# Patient Record
Sex: Male | Born: 1950 | Race: White | Hispanic: No | Marital: Married | Smoking: Never smoker
Health system: Southern US, Community
[De-identification: ages and names within clinical notes are randomized; demographics above are authoritative.]

## PROBLEM LIST (undated history)

## (undated) DIAGNOSIS — I10 Essential (primary) hypertension: Secondary | ICD-10-CM

---

## 2005-02-19 ENCOUNTER — Ambulatory Visit: Payer: Self-pay | Admitting: Internal Medicine

## 2005-03-14 ENCOUNTER — Encounter: Payer: Self-pay | Admitting: Internal Medicine

## 2008-10-01 ENCOUNTER — Ambulatory Visit: Payer: Self-pay | Admitting: Gastroenterology

## 2018-09-20 ENCOUNTER — Emergency Department
Admission: EM | Admit: 2018-09-20 | Discharge: 2018-09-20 | Disposition: A | Discharge status: Home / Self Care | Payer: Medicare HMO | Attending: Emergency Medicine | Admitting: Emergency Medicine

## 2018-09-20 ENCOUNTER — Other Ambulatory Visit: Payer: Self-pay

## 2018-09-20 ENCOUNTER — Emergency Department: Payer: Medicare HMO

## 2018-09-20 ENCOUNTER — Encounter: Payer: Self-pay | Admitting: Emergency Medicine

## 2018-09-20 DIAGNOSIS — W19XXXA Unspecified fall, initial encounter: Secondary | ICD-10-CM

## 2018-09-20 DIAGNOSIS — W010XXA Fall on same level from slipping, tripping and stumbling without subsequent striking against object, initial encounter: Secondary | ICD-10-CM | POA: Insufficient documentation

## 2018-09-20 DIAGNOSIS — M79651 Pain in right thigh: Secondary | ICD-10-CM | POA: Insufficient documentation

## 2018-09-20 DIAGNOSIS — I10 Essential (primary) hypertension: Secondary | ICD-10-CM | POA: Diagnosis not present

## 2018-09-20 HISTORY — DX: Essential (primary) hypertension: I10

## 2018-09-20 MED ORDER — ONDANSETRON HCL 4 MG PO TABS: 4.0000 mg | ORAL_TABLET | Freq: Three times a day (TID) | ORAL | 1 refills | Status: AC | PRN

## 2018-09-20 MED ORDER — ONDANSETRON 4 MG PO TBDP
4.0000 mg | ORAL_TABLET | Freq: Once | ORAL | Status: AC
  Administered 2018-09-20: 4 mg via ORAL
  Filled 2018-09-20: qty 1

## 2018-09-20 MED ORDER — TRAMADOL HCL 50 MG PO TABS
50.0000 mg | ORAL_TABLET | Freq: Once | ORAL | Status: AC
  Administered 2018-09-20: 22:00:00 50 mg via ORAL
  Filled 2018-09-20: qty 1

## 2018-09-20 MED ORDER — TRAMADOL HCL 50 MG PO TABS: 50.0000 mg | ORAL_TABLET | Freq: Four times a day (QID) | ORAL | 0 refills | Status: AC | PRN

## 2018-09-20 NOTE — ED Triage Notes (Signed)
Pt to ED via POV c/o fall. Pt states that he slipped carrying something up the step and landed on his right side. Pt is c/o pain in the right thigh area. Pt is in NAD at this time.

## 2018-09-20 NOTE — ED Provider Notes (Signed)
[facility] Emergency Department Provider Note  ____________________________________________  Time seen: Approximately 8:32 PM  I have reviewed the triage vital signs and the nursing notes.   HISTORY  Chief Complaint Fall and Leg Pain    HPI Larry Bates is a 68 y.o. male presents to the emergency department with right distal lateral thigh pain since patient fell earlier in the evening.  Patient slipped carrying an object.  He denies hitting his head or his neck.  He denies headache.  No numbness or tingling in the upper or lower extremities.  Patient reports that he has no pain when sitting and 8 out of 10 pain when attempting to bear weight.  He denies pain along the groin or along the low back.  He denies current use of blood thinners.  No other alleviating measures of been attempted.   Past Medical History:  Diagnosis Date  . Hypertension     There are no active problems to display for this patient.   History reviewed. No pertinent surgical history.  Prior to Admission medications   Not on File    Allergies Brassica oleracea italica  No family history on file.  Social History Social History   Tobacco Use  . Smoking status: Never Smoker  . Smokeless tobacco: Never Used  Substance Use Topics  . Alcohol use: Yes  . Drug use: Not Currently     Review of Systems  Constitutional: No fever/chills Eyes: No visual changes. No discharge ENT: No upper respiratory complaints. Cardiovascular: no chest pain. Respiratory: no cough. No SOB. Gastrointestinal: No abdominal pain.  No nausea, no vomiting.  No diarrhea.  No constipation. Genitourinary: Negative for dysuria. No hematuria Musculoskeletal: Patient has right thigh pain.  Skin: Negative for rash, abrasions, lacerations, ecchymosis. Neurological: Negative for headaches, focal weakness or numbness.   ____________________________________________   PHYSICAL EXAM:  VITAL SIGNS: ED  Triage Vitals  Enc Vitals Group     BP 09/20/18 1743 131/67     Pulse Rate 09/20/18 1743 74     Resp 09/20/18 1743 18     Temp 09/20/18 1743 98.1 F (36.7 C)     Temp Source 09/20/18 1743 Oral     SpO2 09/20/18 1743 96 %     Weight 09/20/18 1744 185 lb (83.9 kg)     Height 09/20/18 1744  (1.727 m)     Head Circumference --      Peak Flow --      Pain Score 09/20/18 1746 2     Pain Loc --      Pain Edu? --      Excl. in GC? --      Constitutional: Alert and oriented. Well appearing and in no acute distress. Eyes: Conjunctivae are normal. PERRL. EOMI. Head: Atraumatic. ENT:      Ears: TMs are pearly.       Nose: No congestion/rhinnorhea.      Mouth/Throat: Mucous membranes are moist.  Neck: No stridor. Full range of motion. No midline c spine tenderness.   Cardiovascular: Normal rate, regular rhythm. Normal S1 and S2.  Good peripheral circulation. Respiratory: Normal respiratory effort without tachypnea or retractions. Lungs CTAB. Good air entry to the bases with no decreased or absent breath sounds. Gastrointestinal: Bowel sounds 4 quadrants. Soft and nontender to palpation. No guarding or rigidity. No palpable masses. No distention. No CVA tenderness. Musculoskeletal: No groin pain with internal and external rotation of the right hip.  Patient is able to  perform a positive straight leg raise test, right.  Patient has difficulty ambulating and gestures to right lateral thigh when walking.  He performs full range of motion at the right knee without deficits noted with provocative testing.  Palpable dorsalis pedis pulse, right. Neurologic:  Normal speech and language. No gross focal neurologic deficits are appreciated.  Skin:  Skin is warm, dry and intact. No rash noted. Psychiatric: Mood and affect are normal. Speech and behavior are normal. Patient exhibits appropriate insight and judgement.   ____________________________________________   LABS (all labs ordered are  listed, but only abnormal results are displayed)  Labs Reviewed - No data to display ____________________________________________  EKG   ____________________________________________  RADIOLOGY I personally viewed and evaluated these images as part of my medical decision making, as well as reviewing the written report by the radiologist  Dg Femur, Min 2 Views Right  Result Date: 09/20/2018 CLINICAL DATA:  68 year old male status post fall down steps with pain. EXAM: RIGHT FEMUR 2 VIEWS COMPARISON:  None. FINDINGS: Calcified right femoral artery atherosclerosis. Right femoral head normally located. Intact proximal right femur. Visible pelvis appears intact. Right SI joint appears normal. Intact right femoral shaft. Normal alignment at the right knee. No knee joint effusion identified. No acute osseous abnormality identified. IMPRESSION: 1. No acute fracture or dislocation identified about the right femur. 2. Calcified femoral artery atherosclerosis. Electronically Signed   By: Odessa Fleming M.D.   On: 09/20/2018 18:52    ____________________________________________    PROCEDURES  Procedure(s) performed:    Procedures    Medications - No data to display   ____________________________________________   INITIAL IMPRESSION / ASSESSMENT AND PLAN / ED COURSE  Pertinent labs & imaging results that were available during my care of the patient were reviewed by me and considered in my medical decision making (see chart for details).  Review of the [place] CSRS was performed in accordance of the [facility] prior to dispensing any controlled drugs.      Assessment and Plan:  Fall  68 year old male presents to the emergency department with right lateral thigh pain that occurred after a fall in his home earlier in the evening.  On physical exam, patient had difficulty with ambulation and reported pain along the right lateral thigh he walks with a limp.  He had no groin pain with internal and external  rotation of the right hip and was able to demonstrate full range of motion at the right knee.  Had no paraspinal muscle tenderness along the lumbar spine.  Differential diagnosis included hip fracture, thigh hematoma and leg contusion.  X-ray examination of the right hip revealed no bony abnormality.  Ultrasound of right thigh revealed no evidence of hematoma formation.  Patient was given tramadol in the emergency department and Zofran.  He was discharged with tramadol and Zofran and advised to follow-up with orthopedics.  He voiced understanding.  He was given strict return precautions to return to the emergency department with new or worsening symptoms.  All patient questions were answered.   ____________________________________________  FINAL CLINICAL IMPRESSION(S) / ED DIAGNOSES  Final diagnoses:  Fall      NEW MEDICATIONS STARTED DURING THIS VISIT:  ED Discharge Orders    None          This chart was dictated using voice recognition software/Dragon. Despite best efforts to proofread, errors can occur which can change the meaning. Any change was purely unintentional.    Orvil Feil, PA-C 09/20/18 2212  Emily FilbertWilliams, Jonathan E, MD 09/20/18 2240

## 2018-09-20 NOTE — ED Notes (Signed)
Pt tripped today and has knee/leg pain (rt)

## 2020-02-15 IMAGING — US RIGHT LOWER EXTREMITY SOFT TISSUE ULTRASOUND LIMITED
1 series · 8 of 8 positions shown · non-contrast
Comparison: None.

CLINICAL DATA: Recent fall with possible thigh hematoma

EXAM:
ULTRASOUND RIGHT LOWER EXTREMITY LIMITED
TECHNIQUE: Ultrasound examination of the lower extremity soft tissues was
performed in the area of clinical concern.

[Series 1: right lower extremity soft tissue ultrasound limit · 8 of 8 slices shown]
[im 1/8]
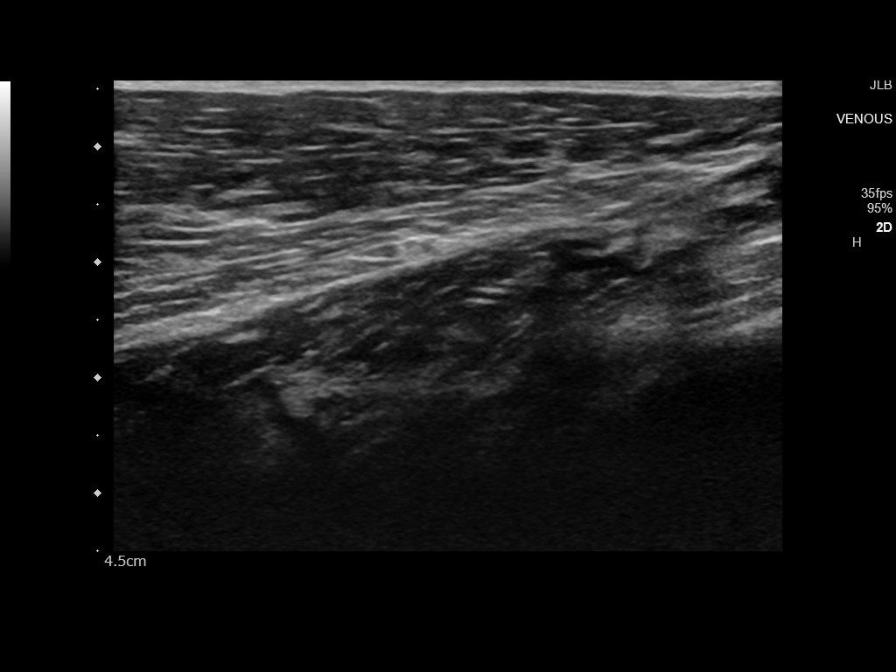
[im 2/8]
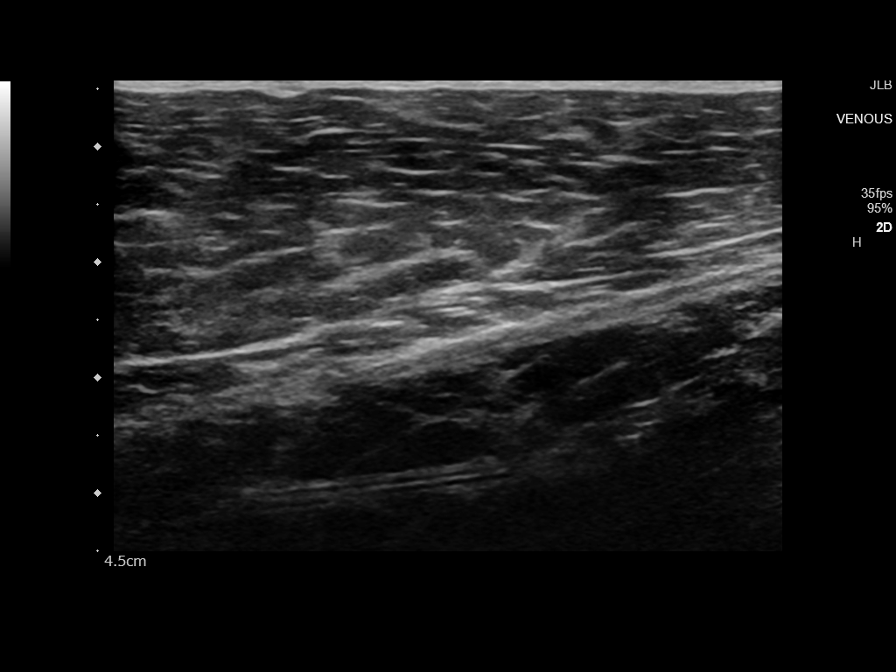
[im 3/8]
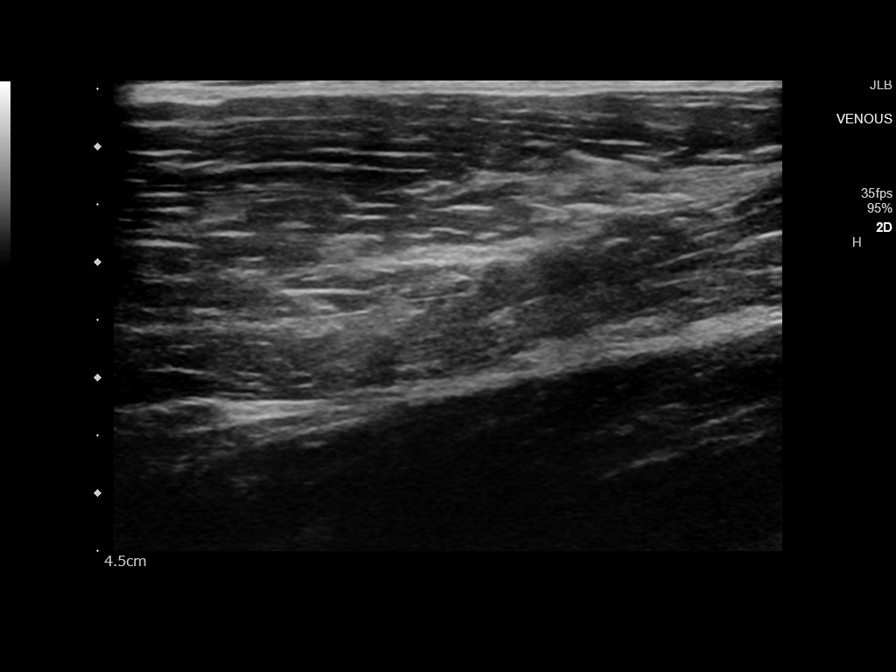
[im 4/8]
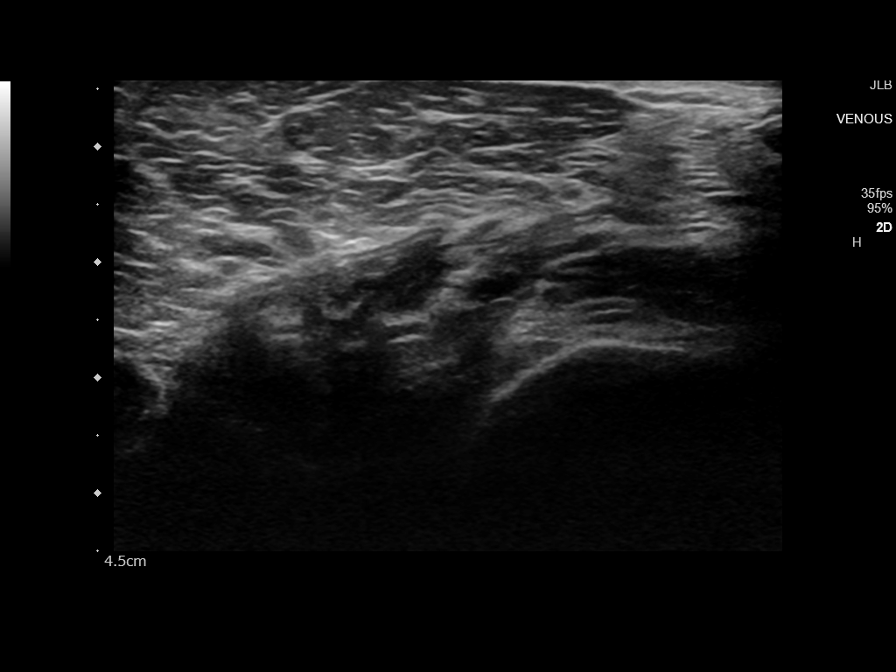
[im 5/8]
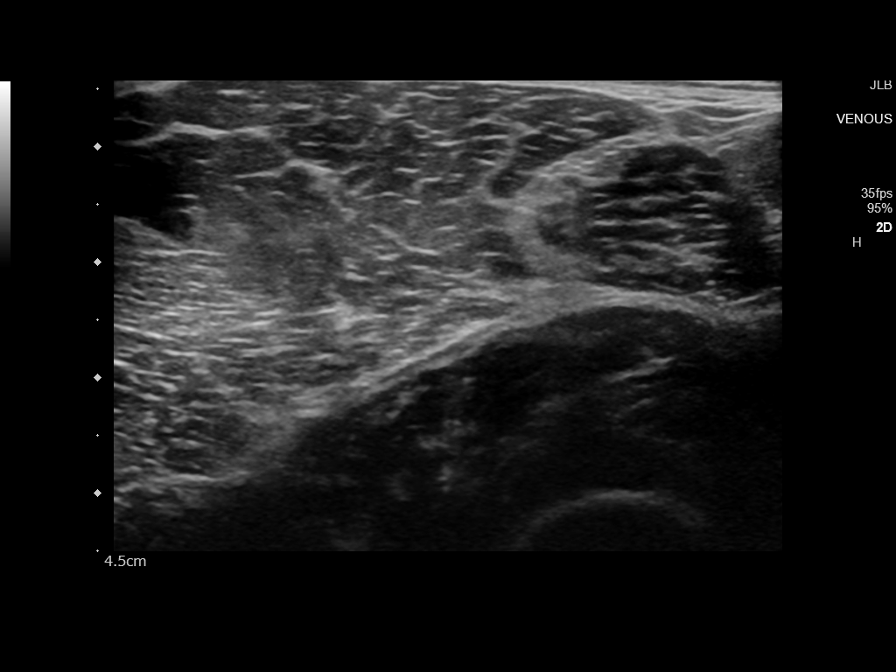
[im 6/8]
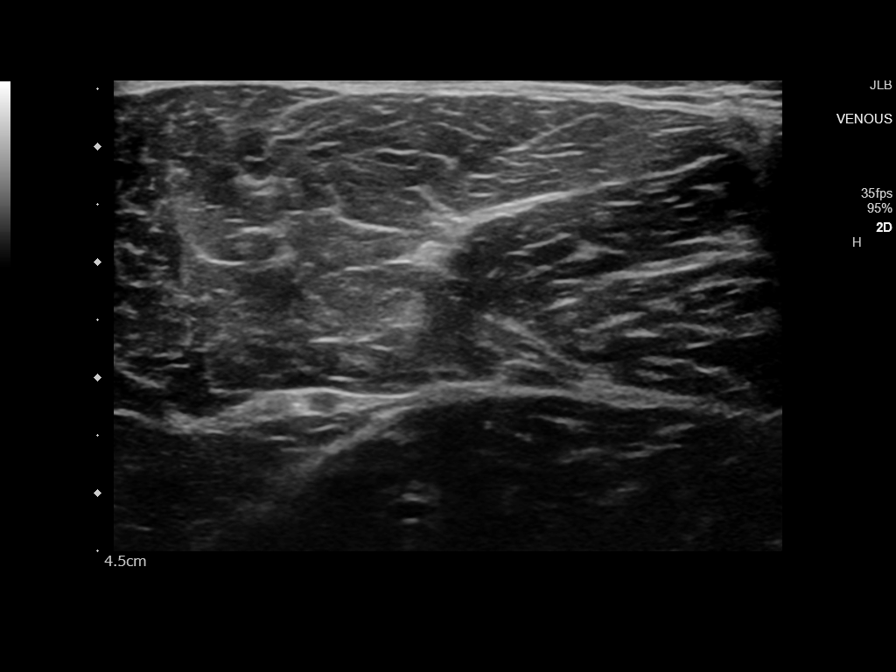
[im 7/8]
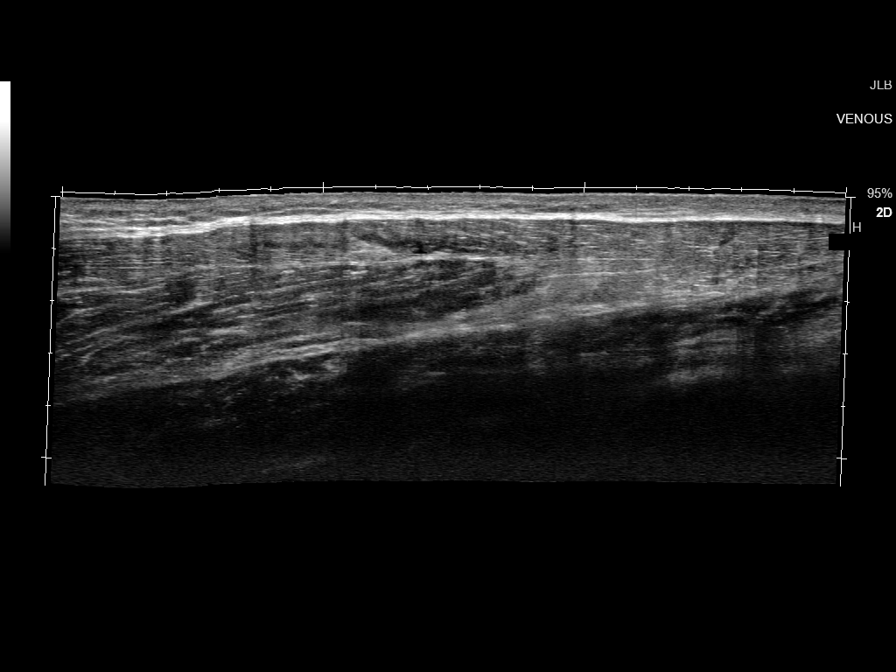
[im 8/8]
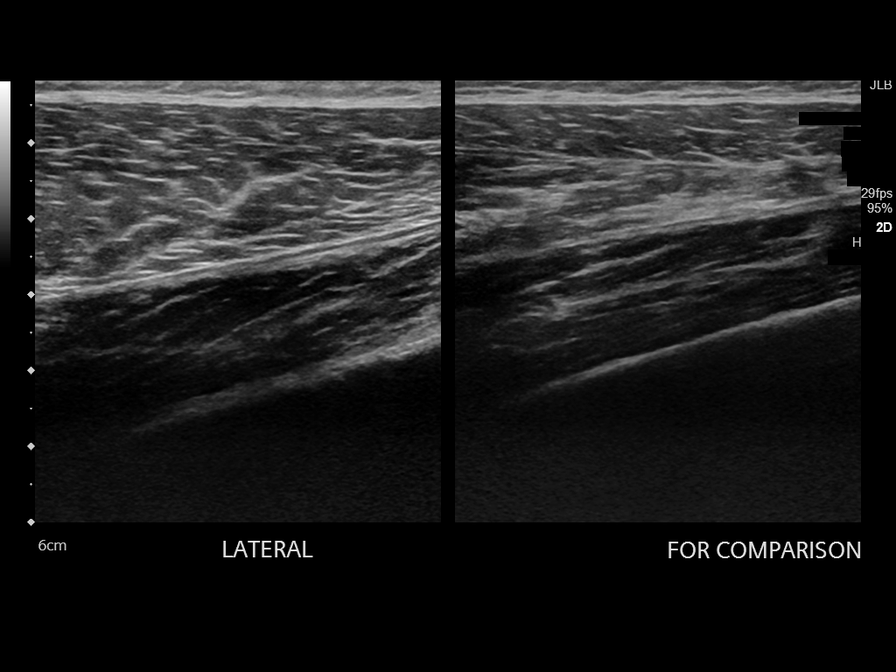

[8 of 8 positions shown; findings below may reference images not displayed]

FINDINGS: Scanning in the area of clinical concern shows no focal hematoma or
fluid collection.
IMPRESSION: No evidence of hematoma or fluid collection.

## 2020-02-15 IMAGING — CR RIGHT FEMUR 2 VIEWS
1 series · 5 of 5 positions shown · non-contrast
Comparison: None.

CLINICAL DATA: 67-year-old male status post fall down steps with
pain.

EXAM:
RIGHT FEMUR 2 VIEWS

[Series 1: dg femur, min 2 views right · 0.14mm/px · 5 of 5 slices shown]
[im 1/5]
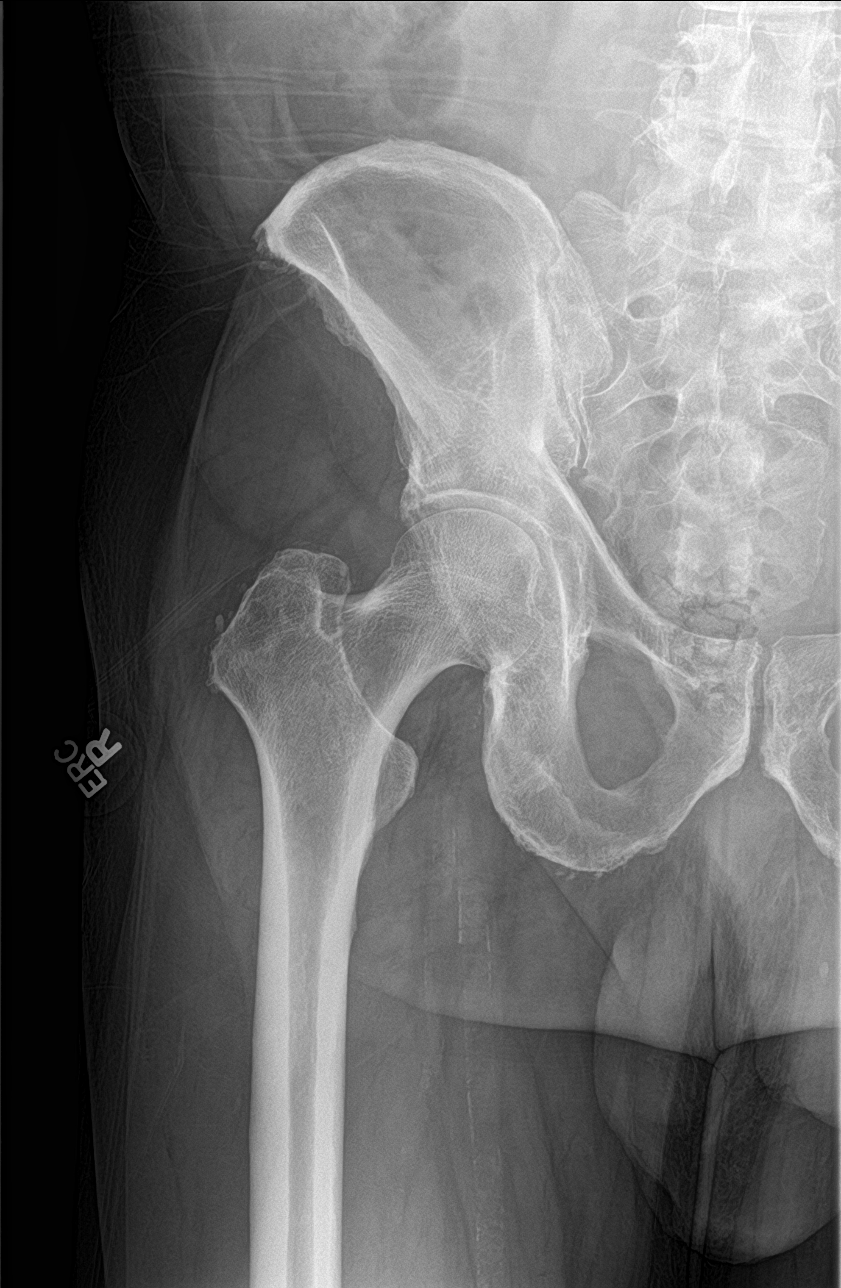
[im 2/5]
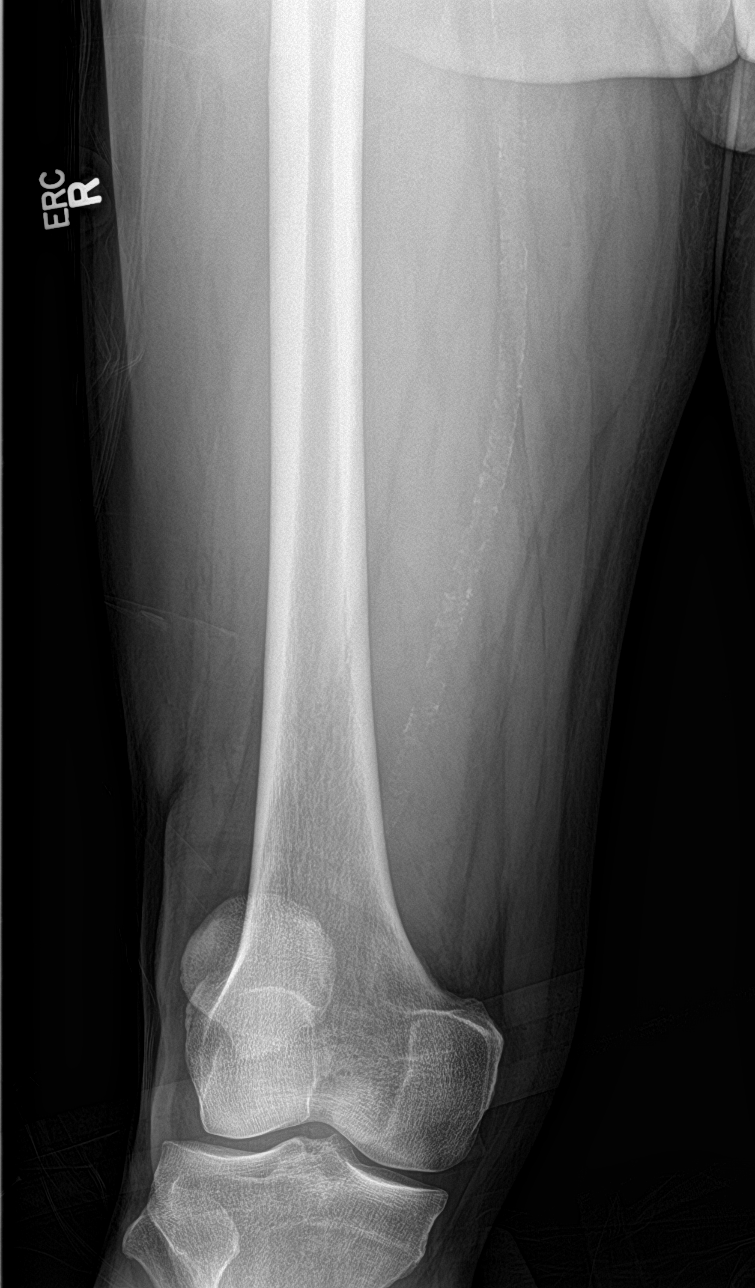
[im 3/5]
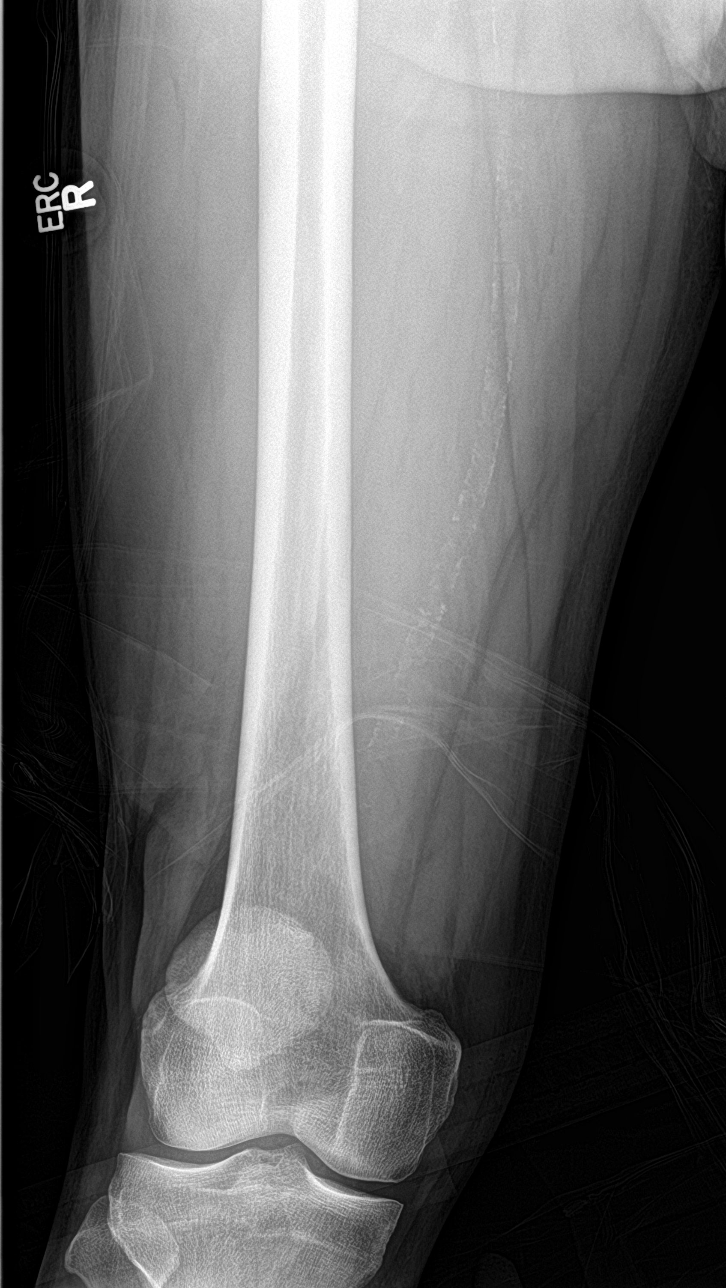
[im 4/5]
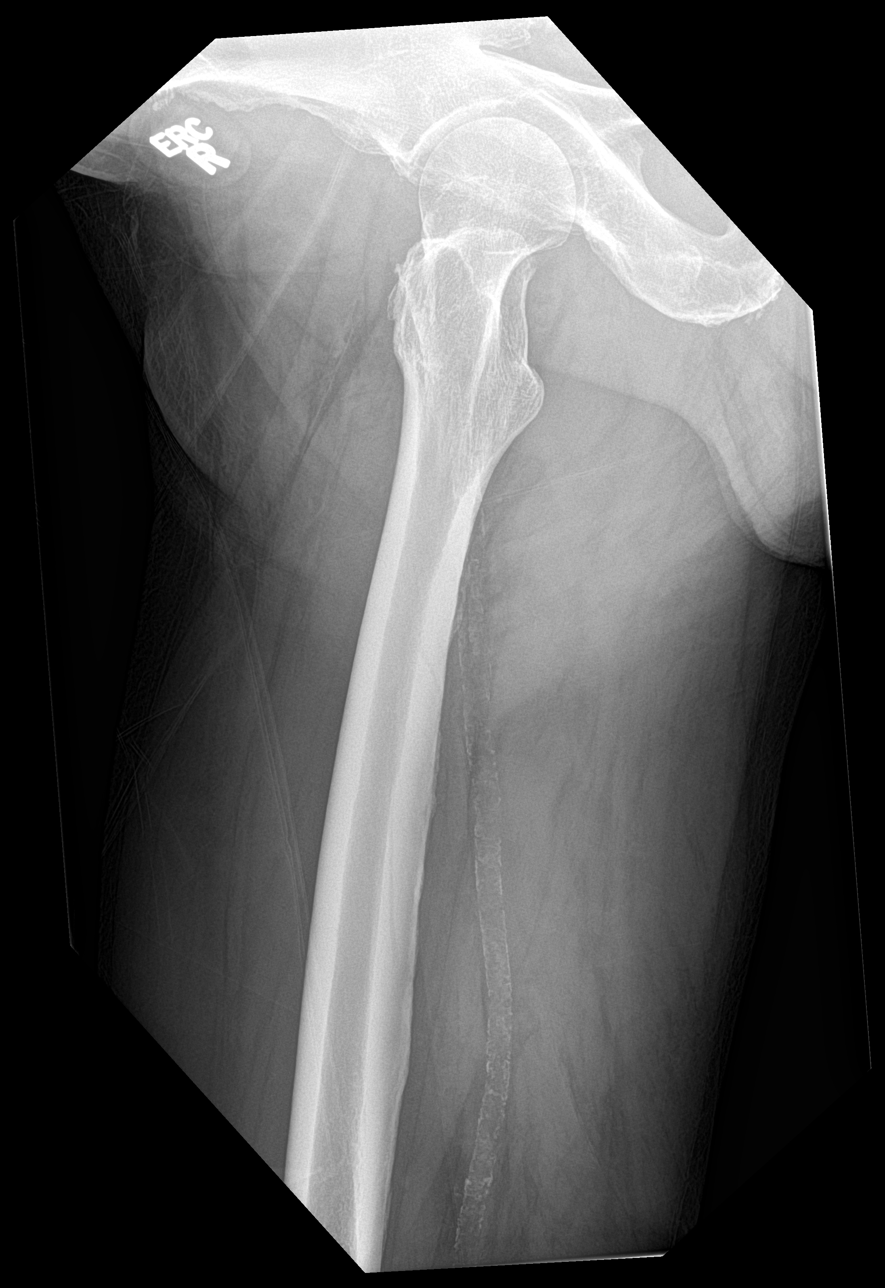
[im 5/5]
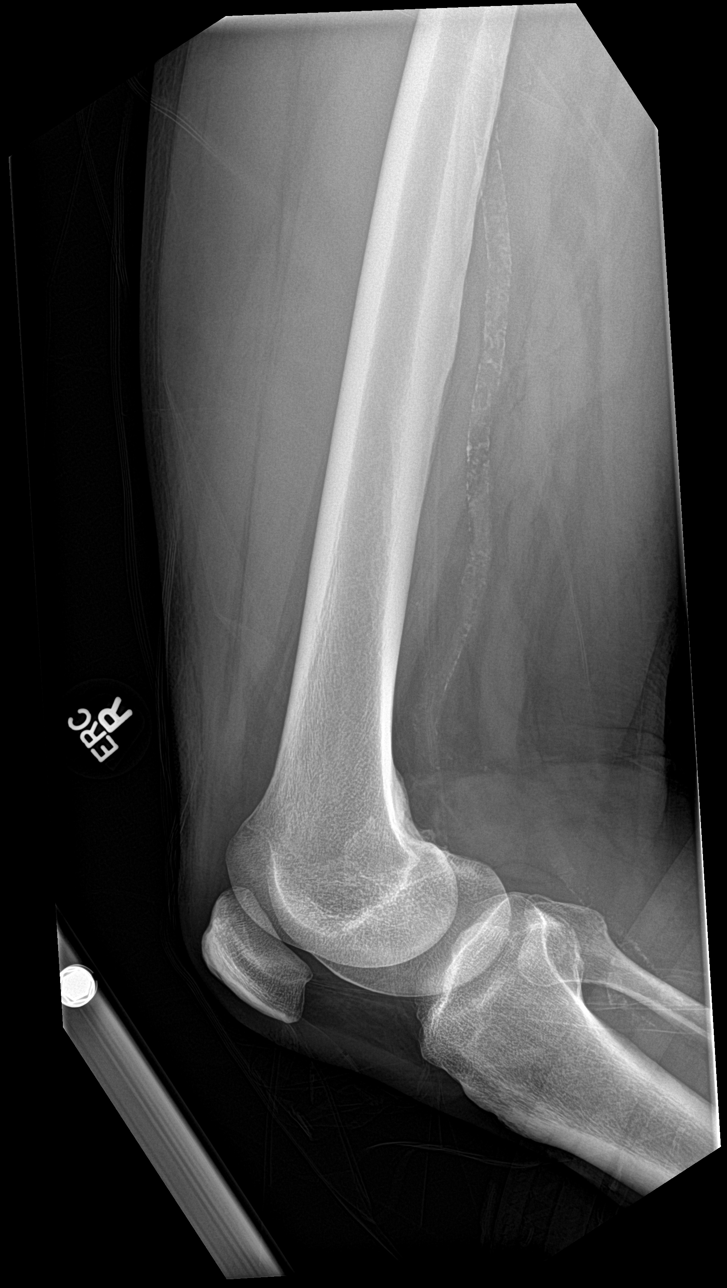

[5 of 5 positions shown; findings below may reference images not displayed]

FINDINGS: Calcified right femoral artery atherosclerosis. Right femoral head
normally located. Intact proximal right femur. Visible pelvis
appears intact. Right SI joint appears normal. Intact right femoral
shaft. Normal alignment at the right knee. No knee joint effusion
identified. No acute osseous abnormality identified.
IMPRESSION: 1. No acute fracture or dislocation identified about the right
femur.
2. Calcified femoral artery atherosclerosis.

## 2021-05-30 ENCOUNTER — Other Ambulatory Visit: Payer: Self-pay | Admitting: Internal Medicine

## 2021-05-30 DIAGNOSIS — R748 Abnormal levels of other serum enzymes: Secondary | ICD-10-CM

## 2021-05-30 DIAGNOSIS — N1831 Chronic kidney disease, stage 3a: Secondary | ICD-10-CM

## 2021-06-06 ENCOUNTER — Other Ambulatory Visit: Payer: Self-pay

## 2021-06-06 ENCOUNTER — Ambulatory Visit
Admission: RE | Admit: 2021-06-06 | Discharge: 2021-06-06 | Disposition: A | Discharge status: Home / Self Care | Payer: Medicare HMO | Source: Ambulatory Visit | Attending: Internal Medicine | Admitting: Internal Medicine

## 2021-06-06 DIAGNOSIS — R748 Abnormal levels of other serum enzymes: Secondary | ICD-10-CM | POA: Insufficient documentation

## 2021-06-06 DIAGNOSIS — N1831 Chronic kidney disease, stage 3a: Secondary | ICD-10-CM | POA: Insufficient documentation

## 2022-11-01 IMAGING — US US RENAL
1 series · 14 of 25 positions shown · non-contrast
Comparison: None.

CLINICAL DATA: Chronic kidney disease.

EXAM:
RENAL / URINARY TRACT ULTRASOUND COMPLETE

[Series 1: us renal · 0.24mm/px · 14 of 54 slices shown]
[im 1/54]
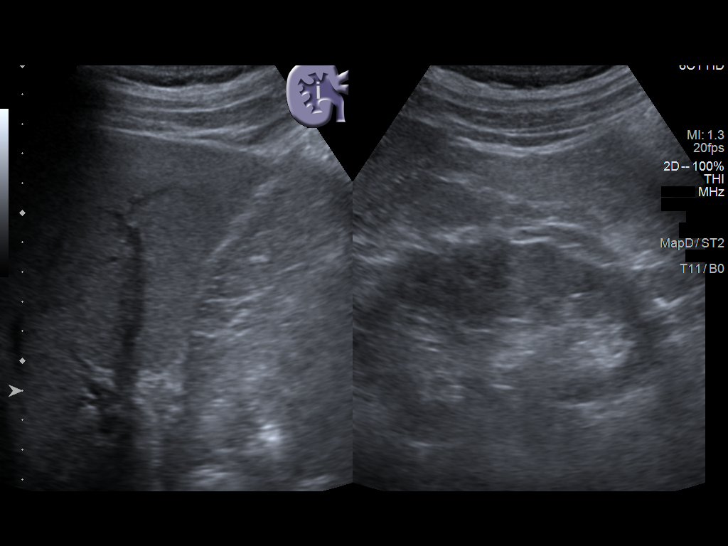
[im 5/54]
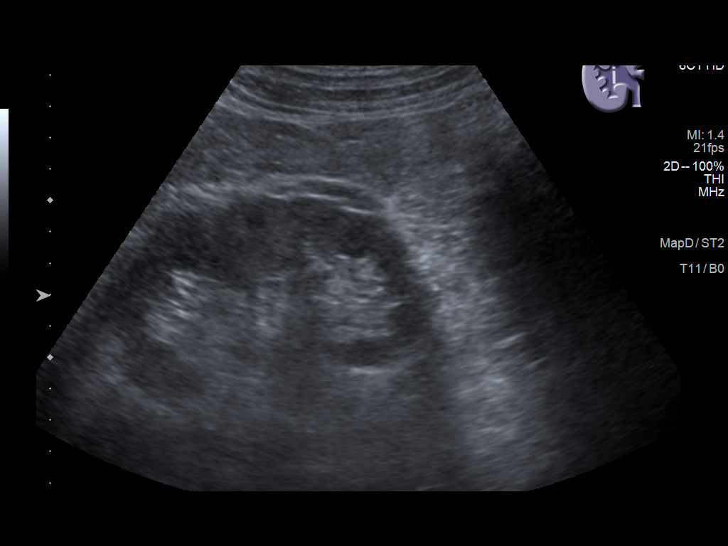
[im 9/54]
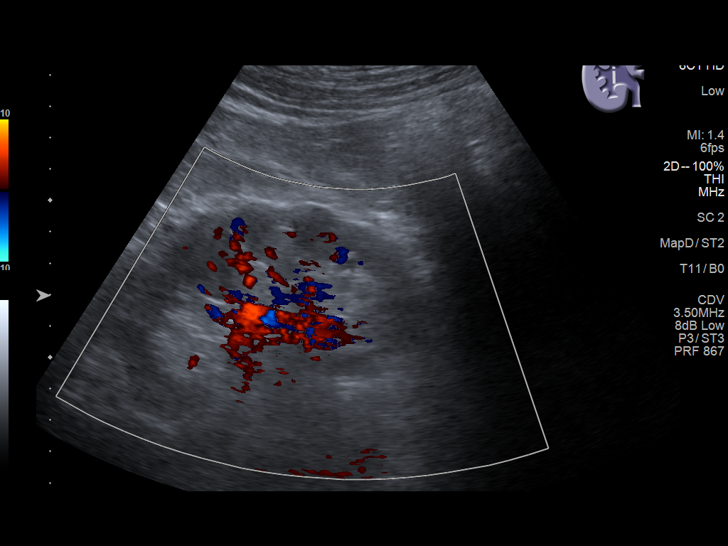
[im 14/54]
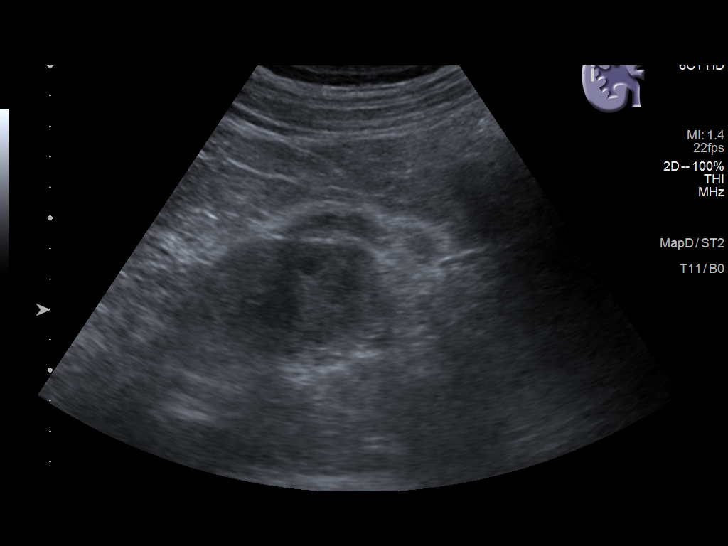
[im 18/54]
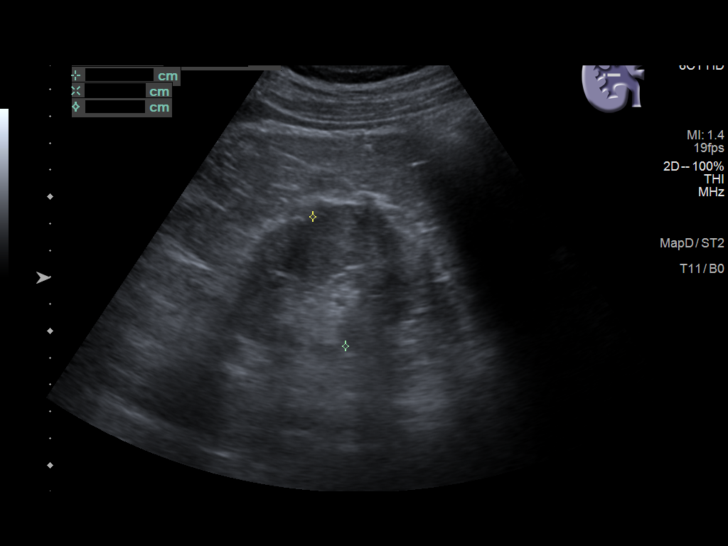
[im 20/54]
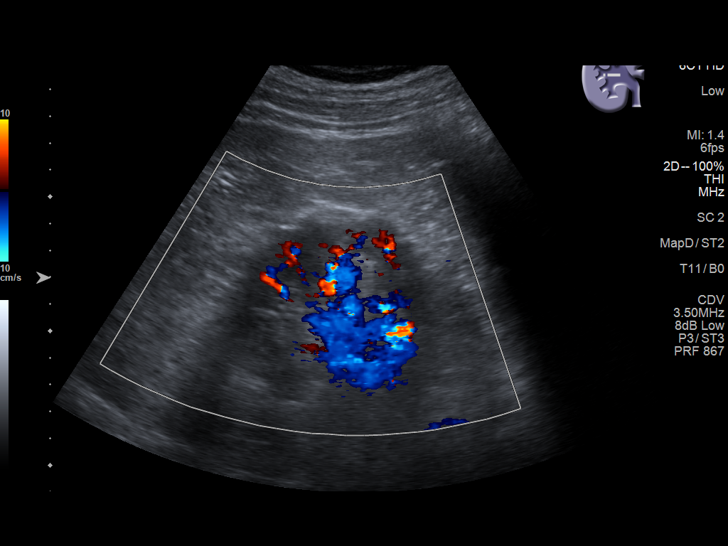
[im 25/54]
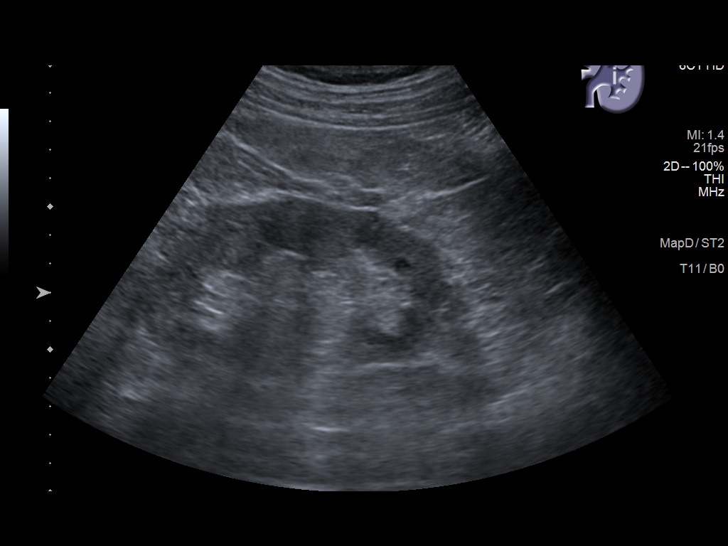
[im 29/54]
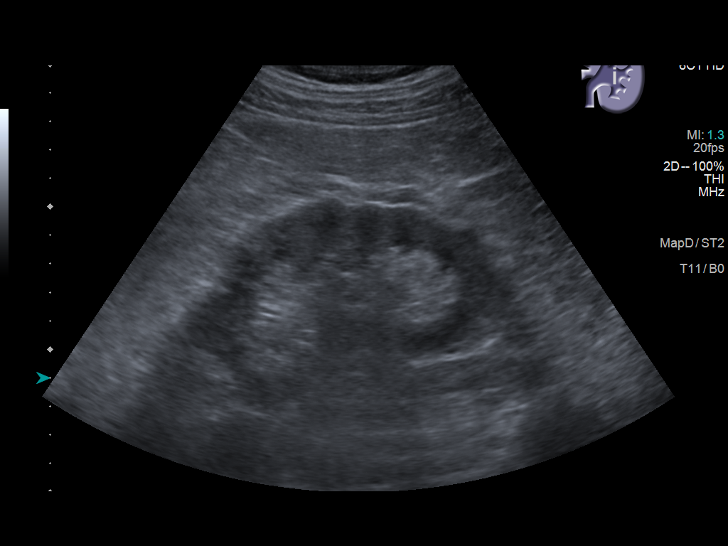
[im 34/54]
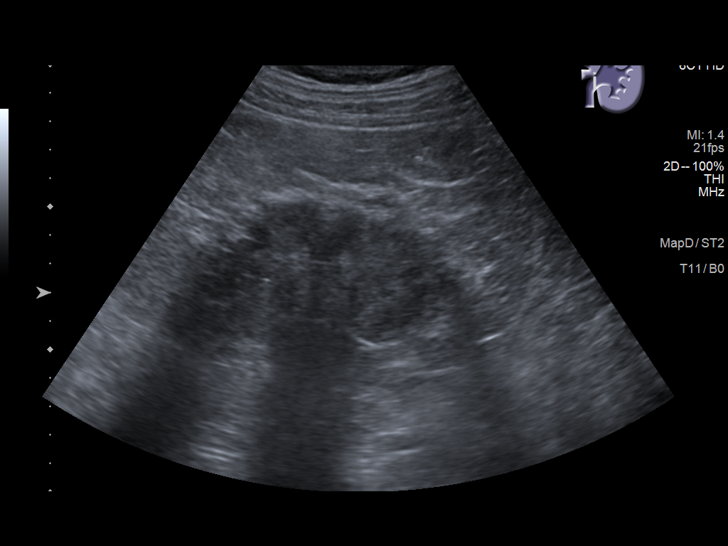
[im 36/54]
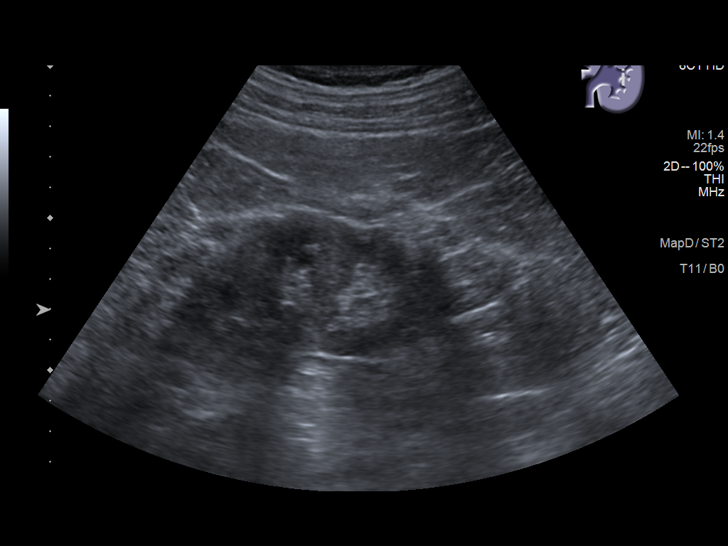
[im 40/54]
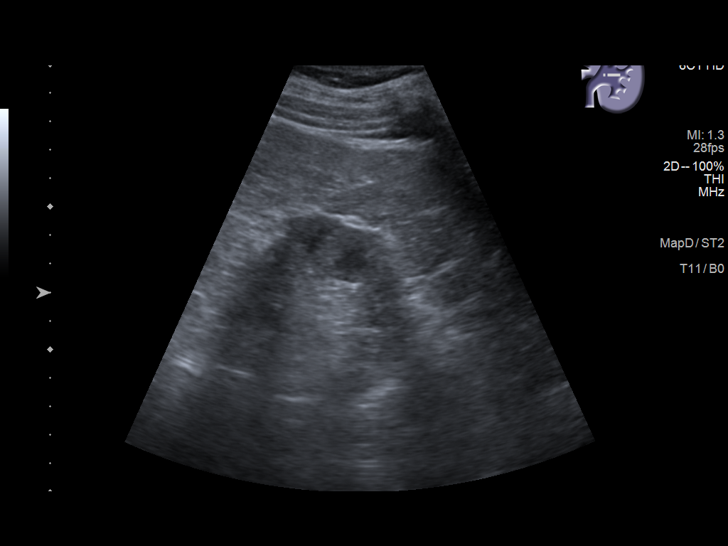
[im 45/54]
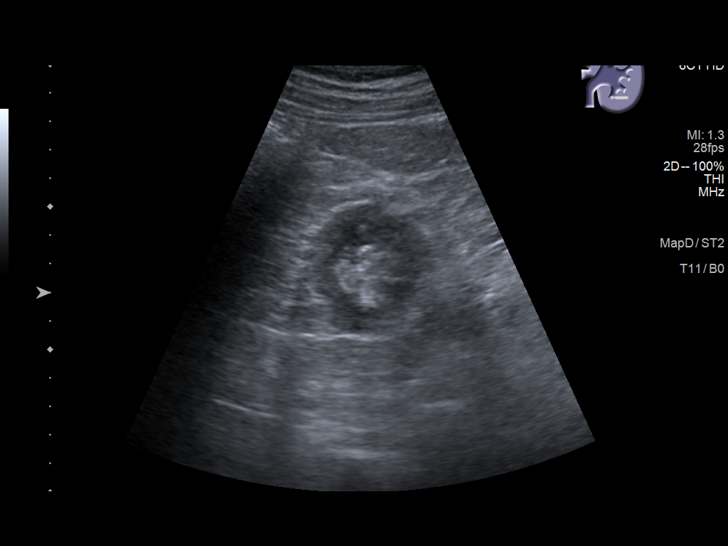
[im 49/54]
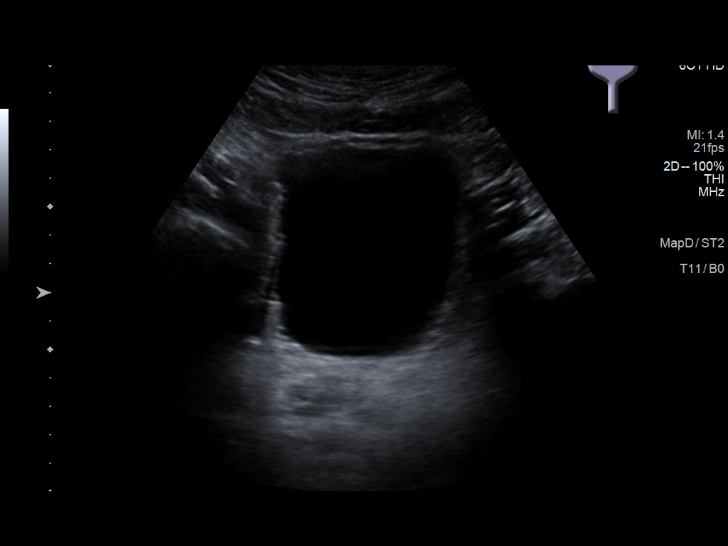
[im 54/54]
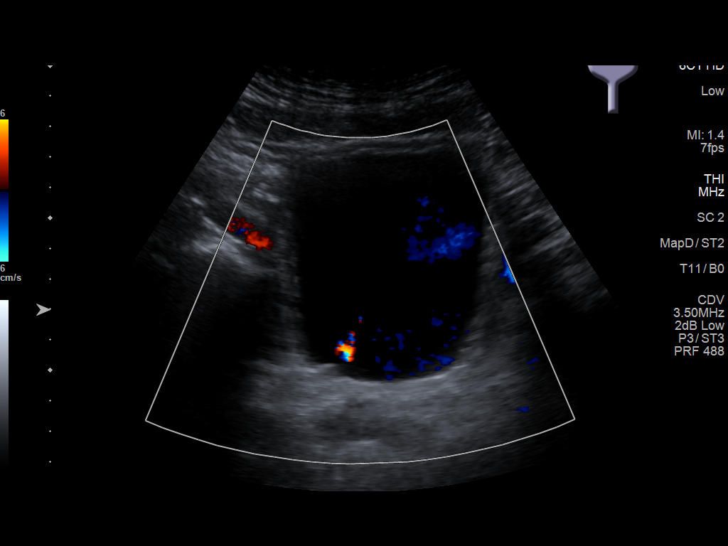

[14 of 25 positions shown; findings below may reference images not displayed]

FINDINGS: Right Kidney:

Renal measurements: 10.8 x 5.5 x 5.0 cm = volume: 154 mL.
Echogenicity within normal limits. No mass or hydronephrosis
visualized.

Left Kidney:

Renal measurements: 10.9 x 5.9 x 5.1 cm = volume: 172 mL.
Echogenicity within normal limits. No mass or hydronephrosis
visualized.

Bladder:

Appears normal for degree of bladder distention.

Other:

None.
IMPRESSION: Unremarkable renal ultrasound.
